# Patient Record
Sex: Male | Born: 1960
Health system: Southern US, Community
[De-identification: ages and names within clinical notes are randomized; demographics above are authoritative.]

## PROBLEM LIST (undated history)

## (undated) DIAGNOSIS — E119 Type 2 diabetes mellitus without complications: Secondary | ICD-10-CM

---

## 2001-11-25 ENCOUNTER — Encounter: Admission: RE | Admit: 2001-11-25 | Discharge: 2001-11-25 | Payer: Self-pay | Admitting: Family Medicine

## 2001-11-25 ENCOUNTER — Encounter: Payer: Self-pay | Admitting: Family Medicine

## 2001-12-15 ENCOUNTER — Ambulatory Visit (HOSPITAL_BASED_OUTPATIENT_CLINIC_OR_DEPARTMENT_OTHER): Admission: RE | Admit: 2001-12-15 | Discharge: 2001-12-15 | Payer: Self-pay | Admitting: Urology

## 2001-12-15 ENCOUNTER — Encounter: Payer: Self-pay | Admitting: Urology

## 2001-12-17 ENCOUNTER — Inpatient Hospital Stay (HOSPITAL_COMMUNITY): Admission: EM | Admit: 2001-12-17 | Discharge: 2001-12-17 | Payer: Self-pay | Admitting: Emergency Medicine

## 2001-12-17 ENCOUNTER — Encounter: Payer: Self-pay | Admitting: Emergency Medicine

## 2001-12-18 ENCOUNTER — Emergency Department (HOSPITAL_COMMUNITY): Admission: EM | Admit: 2001-12-18 | Discharge: 2001-12-18 | Payer: Self-pay | Admitting: Emergency Medicine

## 2015-05-14 DIAGNOSIS — C4491 Basal cell carcinoma of skin, unspecified: Secondary | ICD-10-CM

## 2015-05-14 HISTORY — DX: Basal cell carcinoma of skin, unspecified: C44.91

## 2016-04-17 DIAGNOSIS — E119 Type 2 diabetes mellitus without complications: Secondary | ICD-10-CM | POA: Diagnosis not present

## 2016-04-17 DIAGNOSIS — I10 Essential (primary) hypertension: Secondary | ICD-10-CM | POA: Diagnosis not present

## 2016-04-17 DIAGNOSIS — E1065 Type 1 diabetes mellitus with hyperglycemia: Secondary | ICD-10-CM | POA: Diagnosis not present

## 2016-04-17 DIAGNOSIS — Z794 Long term (current) use of insulin: Secondary | ICD-10-CM | POA: Diagnosis not present

## 2016-04-17 DIAGNOSIS — E11649 Type 2 diabetes mellitus with hypoglycemia without coma: Secondary | ICD-10-CM | POA: Diagnosis not present

## 2016-04-27 DIAGNOSIS — E10649 Type 1 diabetes mellitus with hypoglycemia without coma: Secondary | ICD-10-CM | POA: Diagnosis not present

## 2016-04-27 DIAGNOSIS — E109 Type 1 diabetes mellitus without complications: Secondary | ICD-10-CM | POA: Diagnosis not present

## 2016-04-27 DIAGNOSIS — E11649 Type 2 diabetes mellitus with hypoglycemia without coma: Secondary | ICD-10-CM | POA: Diagnosis not present

## 2016-06-04 DIAGNOSIS — Z794 Long term (current) use of insulin: Secondary | ICD-10-CM | POA: Diagnosis not present

## 2016-06-04 DIAGNOSIS — E109 Type 1 diabetes mellitus without complications: Secondary | ICD-10-CM | POA: Diagnosis not present

## 2016-07-31 DIAGNOSIS — E7800 Pure hypercholesterolemia, unspecified: Secondary | ICD-10-CM | POA: Diagnosis not present

## 2016-07-31 DIAGNOSIS — E1065 Type 1 diabetes mellitus with hyperglycemia: Secondary | ICD-10-CM | POA: Diagnosis not present

## 2016-07-31 DIAGNOSIS — I10 Essential (primary) hypertension: Secondary | ICD-10-CM | POA: Diagnosis not present

## 2016-07-31 DIAGNOSIS — Z794 Long term (current) use of insulin: Secondary | ICD-10-CM | POA: Diagnosis not present

## 2016-09-03 DIAGNOSIS — E109 Type 1 diabetes mellitus without complications: Secondary | ICD-10-CM | POA: Diagnosis not present

## 2016-09-03 DIAGNOSIS — Z794 Long term (current) use of insulin: Secondary | ICD-10-CM | POA: Diagnosis not present

## 2016-10-06 DIAGNOSIS — L989 Disorder of the skin and subcutaneous tissue, unspecified: Secondary | ICD-10-CM | POA: Diagnosis not present

## 2016-10-06 DIAGNOSIS — H8113 Benign paroxysmal vertigo, bilateral: Secondary | ICD-10-CM | POA: Diagnosis not present

## 2016-10-20 DIAGNOSIS — D235 Other benign neoplasm of skin of trunk: Secondary | ICD-10-CM | POA: Diagnosis not present

## 2016-10-20 DIAGNOSIS — L989 Disorder of the skin and subcutaneous tissue, unspecified: Secondary | ICD-10-CM | POA: Diagnosis not present

## 2016-10-28 DIAGNOSIS — I10 Essential (primary) hypertension: Secondary | ICD-10-CM | POA: Diagnosis not present

## 2016-10-28 DIAGNOSIS — R5383 Other fatigue: Secondary | ICD-10-CM | POA: Diagnosis not present

## 2016-10-28 DIAGNOSIS — Z794 Long term (current) use of insulin: Secondary | ICD-10-CM | POA: Diagnosis not present

## 2016-10-28 DIAGNOSIS — E10649 Type 1 diabetes mellitus with hypoglycemia without coma: Secondary | ICD-10-CM | POA: Diagnosis not present

## 2016-10-28 DIAGNOSIS — E1065 Type 1 diabetes mellitus with hyperglycemia: Secondary | ICD-10-CM | POA: Diagnosis not present

## 2017-02-02 DIAGNOSIS — K529 Noninfective gastroenteritis and colitis, unspecified: Secondary | ICD-10-CM | POA: Diagnosis not present

## 2017-02-02 DIAGNOSIS — E1065 Type 1 diabetes mellitus with hyperglycemia: Secondary | ICD-10-CM | POA: Diagnosis not present

## 2017-02-02 DIAGNOSIS — E10649 Type 1 diabetes mellitus with hypoglycemia without coma: Secondary | ICD-10-CM | POA: Diagnosis not present

## 2017-02-02 DIAGNOSIS — I10 Essential (primary) hypertension: Secondary | ICD-10-CM | POA: Diagnosis not present

## 2017-02-04 DIAGNOSIS — K529 Noninfective gastroenteritis and colitis, unspecified: Secondary | ICD-10-CM | POA: Diagnosis not present

## 2017-02-15 DIAGNOSIS — R197 Diarrhea, unspecified: Secondary | ICD-10-CM | POA: Diagnosis not present

## 2017-02-15 DIAGNOSIS — Z23 Encounter for immunization: Secondary | ICD-10-CM | POA: Diagnosis not present

## 2017-02-15 DIAGNOSIS — R1084 Generalized abdominal pain: Secondary | ICD-10-CM | POA: Diagnosis not present

## 2017-02-15 DIAGNOSIS — R143 Flatulence: Secondary | ICD-10-CM | POA: Diagnosis not present

## 2017-02-19 DIAGNOSIS — R198 Other specified symptoms and signs involving the digestive system and abdomen: Secondary | ICD-10-CM | POA: Diagnosis not present

## 2017-02-19 DIAGNOSIS — R197 Diarrhea, unspecified: Secondary | ICD-10-CM | POA: Diagnosis not present

## 2017-02-25 DIAGNOSIS — Z794 Long term (current) use of insulin: Secondary | ICD-10-CM | POA: Diagnosis not present

## 2017-02-25 DIAGNOSIS — E109 Type 1 diabetes mellitus without complications: Secondary | ICD-10-CM | POA: Diagnosis not present

## 2017-03-08 DIAGNOSIS — K900 Celiac disease: Secondary | ICD-10-CM | POA: Diagnosis not present

## 2017-06-03 DIAGNOSIS — R05 Cough: Secondary | ICD-10-CM | POA: Diagnosis not present

## 2017-06-03 DIAGNOSIS — E109 Type 1 diabetes mellitus without complications: Secondary | ICD-10-CM | POA: Diagnosis not present

## 2017-06-03 DIAGNOSIS — Z794 Long term (current) use of insulin: Secondary | ICD-10-CM | POA: Diagnosis not present

## 2017-06-05 ENCOUNTER — Encounter (HOSPITAL_COMMUNITY): Payer: Self-pay

## 2017-06-05 ENCOUNTER — Emergency Department (HOSPITAL_COMMUNITY)
Admission: EM | Admit: 2017-06-05 | Discharge: 2017-06-05 | Disposition: A | Discharge status: Home / Self Care | Payer: BLUE CROSS/BLUE SHIELD | Attending: Physician Assistant | Admitting: Physician Assistant

## 2017-06-05 ENCOUNTER — Emergency Department (HOSPITAL_COMMUNITY): Payer: BLUE CROSS/BLUE SHIELD

## 2017-06-05 DIAGNOSIS — R109 Unspecified abdominal pain: Secondary | ICD-10-CM | POA: Diagnosis not present

## 2017-06-05 DIAGNOSIS — J069 Acute upper respiratory infection, unspecified: Secondary | ICD-10-CM | POA: Diagnosis not present

## 2017-06-05 DIAGNOSIS — R05 Cough: Secondary | ICD-10-CM | POA: Diagnosis not present

## 2017-06-05 DIAGNOSIS — E876 Hypokalemia: Secondary | ICD-10-CM | POA: Diagnosis not present

## 2017-06-05 DIAGNOSIS — E119 Type 2 diabetes mellitus without complications: Secondary | ICD-10-CM | POA: Diagnosis not present

## 2017-06-05 DIAGNOSIS — R059 Cough, unspecified: Secondary | ICD-10-CM

## 2017-06-05 HISTORY — DX: Type 2 diabetes mellitus without complications: E11.9

## 2017-06-05 LAB — COMPREHENSIVE METABOLIC PANEL
ALT: 47 U/L (ref 17–63)
ANION GAP: 9 (ref 5–15)
AST: 39 U/L (ref 15–41)
Albumin: 3.5 g/dL (ref 3.5–5.0)
Alkaline Phosphatase: 75 U/L (ref 38–126)
BILIRUBIN TOTAL: 0.4 mg/dL (ref 0.3–1.2)
BUN: 7 mg/dL (ref 6–20)
CO2: 27 mmol/L (ref 22–32)
Calcium: 9 mg/dL (ref 8.9–10.3)
Chloride: 101 mmol/L (ref 101–111)
Creatinine, Ser: 0.95 mg/dL (ref 0.61–1.24)
GFR calc Af Amer: >60 mL/min (ref >60)
Glucose, Bld: 170 mg/dL — ABNORMAL HIGH (ref 65–99)
POTASSIUM: 3.4 mmol/L — AB (ref 3.5–5.1)
Sodium: 137 mmol/L (ref 135–145)
TOTAL PROTEIN: 6.6 g/dL (ref 6.5–8.1)

## 2017-06-05 LAB — URINALYSIS, ROUTINE W REFLEX MICROSCOPIC
Bacteria, UA: NONE SEEN
Bilirubin Urine: NEGATIVE
Glucose, UA: NEGATIVE mg/dL
Hgb urine dipstick: NEGATIVE
Ketones, ur: NEGATIVE mg/dL
Leukocytes, UA: NEGATIVE
Nitrite: NEGATIVE
Protein, ur: NEGATIVE mg/dL
Specific Gravity, Urine: 1.015 (ref 1.005–1.030)
Squamous Epithelial / HPF: NONE SEEN
pH: 5 (ref 5.0–8.0)

## 2017-06-05 LAB — CBC
HEMATOCRIT: 38.5 % — AB (ref 39.0–52.0)
HEMOGLOBIN: 12.8 g/dL — AB (ref 13.0–17.0)
MCH: 30.8 pg (ref 26.0–34.0)
MCHC: 33.2 g/dL (ref 30.0–36.0)
MCV: 92.8 fL (ref 78.0–100.0)
Platelets: 295 10*3/uL (ref 150–400)
RBC: 4.15 MIL/uL — ABNORMAL LOW (ref 4.22–5.81)
RDW: 12.9 % (ref 11.5–15.5)
WBC: 13.1 10*3/uL — AB (ref 4.0–10.5)

## 2017-06-05 LAB — LIPASE, BLOOD: Lipase: 22 U/L (ref 11–51)

## 2017-06-05 MED ORDER — POTASSIUM CHLORIDE CRYS ER 20 MEQ PO TBCR
40.0000 meq | EXTENDED_RELEASE_TABLET | Freq: Once | ORAL | Status: AC
  Administered 2017-06-05: 40 meq via ORAL
  Filled 2017-06-05: qty 2

## 2017-06-05 NOTE — Discharge Instructions (Signed)
Your potassium was slightly low, but you were given some potassium here. See the list of foods below to help increase your potassium in your diet. Alternate between tylenol and motrin as needed for pain. Alternate between ice and heat to areas of soreness, no more than 20 minutes at a time every hour for each. Follow up with primary care physician for recheck of ongoing symptoms in the next 5-7 days. Return to ER for emergent changing or worsening of symptoms.

## 2017-06-05 NOTE — ED Provider Notes (Signed)
[facility] DEPARTMENT Provider Note   CSN: [id number] Arrival date & time: 06/05/17  4098     History   Chief Complaint Chief Complaint  Patient presents with  . Flank Pain  . URI    HPI Caleb Dominguez is a 56 y.o. male with a PMHx of DM1, HTN, nephrolithiasis, and HLD, who presents to the ED with complaints of bilateral flank pain L>R x 2 days.  Patient states that he has had cold symptoms including dry cough, sinus congestion, sore throat, fatigue, 2-4 episodes daily of NB looser than normal stool, and low-grade fever 99.9 over the last 1 week, he initially improved but then got worse, was seen by his PCP and given a Z-Pak which he started yesterday when his temperature became 99.9.  He states however that 2 days ago he developed gradual onset flank pain which is why he came to the emergency room.  Patient states this feels different than his prior kidney stones.  He describes the pain as 2/10 constant waxing and waning dull and aching nonradiating flank pain worse on the left but somewhat bilaterally, which worsens with laying flat, improves with sitting up and using heat, and has been unrelieved with ibuprofen and Tylenol.  He denies any recent heavy lifting or injuries to his back.  He denies fevers >100, chills, ear drainage, drooling, trismus, wheezing, CP, SOB, abd pain, nausea/vomiting, constipation, obstipation, melena, hematochezia, hematuria, dysuria, urine frequency, testicular pain/swelling, penile discharge, incontinence of urine/stool, saddle anesthesia/cauda equina symptoms, myalgias, arthralgias, numbness, tingling, focal weakness, or any other complaints at this time.     The history is provided by the patient and medical records. No language interpreter was used.  Flank Pain  This is a new problem. The current episode started 2 days ago. The problem occurs constantly. The problem has not changed since onset.Pertinent negatives include no  chest pain, no abdominal pain and no shortness of breath. Exacerbated by: laying down. The symptoms are relieved by heat (sitting up, and heat). He has tried a warm compress and acetaminophen (and ibuprofen) for the symptoms. The treatment provided mild relief.  URI   Associated symptoms include diarrhea, congestion, sore throat and cough. Pertinent negatives include no chest pain, no abdominal pain, no nausea, no vomiting, no dysuria and no wheezing.    Past Medical History:  Diagnosis Date  . Diabetes mellitus without complication ([place])     There are no active problems to display for this patient.   History reviewed. No pertinent surgical history.     Home Medications    Prior to Admission medications   Not on File    Family History No family history on file.  Social History Social History   Tobacco Use  . Smoking status: Not on file  Substance Use Topics  . Alcohol use: Not on file  . Drug use: Not on file     Allergies   Gluten meal   Review of Systems Review of Systems  Constitutional: Positive for fatigue. Negative for chills and fever.  HENT: Positive for congestion and sore throat. Negative for drooling, ear discharge and trouble swallowing.   Respiratory: Positive for cough. Negative for shortness of breath and wheezing.   Cardiovascular: Negative for chest pain.  Gastrointestinal: Positive for diarrhea. Negative for abdominal pain, blood in stool, constipation, nausea and vomiting.  Genitourinary: Positive for flank pain. Negative for difficulty urinating (no incontinence), discharge, dysuria, frequency, hematuria, scrotal swelling and testicular pain.  Musculoskeletal:  Negative for arthralgias and myalgias.  Skin: Negative for color change.  Allergic/Immunologic: Positive for immunocompromised state (DM1).  Neurological: Negative for weakness and numbness.  Psychiatric/Behavioral: Negative for confusion.   All other systems reviewed and are negative  for acute change except as noted in the HPI.    Physical Exam Updated Vital Signs BP 130/82 (BP Location: Right Arm)   Pulse 77  Temp 98.1 F (36.7 C) (Oral)   Resp 12   Ht 5' 5.5" (1.664 m)   Wt 77.1 kg (170 lb)   SpO2 100%   BMI 27.86 kg/m   Physical Exam  Constitutional: He is oriented to person, place, and time. Vital signs are normal. He appears well-developed and well-nourished.  Non-toxic appearance. No distress.  Afebrile, nontoxic, NAD  HENT:  Head: Normocephalic and atraumatic.  Nose: Mucosal edema present.  Mouth/Throat: Uvula is midline and mucous membranes are normal. No trismus in the jaw. No uvula swelling. Posterior oropharyngeal erythema present. No oropharyngeal exudate, posterior oropharyngeal edema or tonsillar abscesses. Tonsils are 1+ on the right. Tonsils are 1+ on the left. No tonsillar exudate.  Nose mildly congested. Oropharynx mildly injected, without uvular swelling or deviation, no trismus or drooling, mild 1+ b/l tonsillar hypertrophy but no tonsillar swelling or exudates.    Eyes: Conjunctivae and EOM are normal. Right eye exhibits no discharge. Left eye exhibits no discharge.  Neck: Normal range of motion. Neck supple.  Cardiovascular: Normal rate, regular rhythm, normal heart sounds and intact distal pulses. Exam reveals no gallop and no friction rub.  No murmur heard. Pulmonary/Chest: Effort normal and breath sounds normal. No respiratory distress. He has no decreased breath sounds. He has no wheezes. He has no rhonchi. He has no rales.  CTAB in all lung fields, no w/r/r, no hypoxia or increased WOB, speaking in full sentences, SpO2 99% on RA   Abdominal: Soft. Normal appearance and bowel sounds are normal. He exhibits no distension. There is no tenderness. There is no rigidity, no rebound, no guarding, no CVA tenderness, no tenderness at McBurney's point and negative Murphy's sign.  Soft, NTND, +BS throughout, no r/g/r, neg murphy's, neg mcburney's, no  CVA TTP   Musculoskeletal: Normal range of motion.       Thoracic back: He exhibits normal range of motion, no tenderness, no bony tenderness and no spasm.  All spinal levels with FROM intact without spinous process TTP, no bony stepoffs or deformities, no paraspinous muscle TTP or muscle spasms. Strength and sensation grossly intact in all extremities, gait steady and nonantalgic. No overlying skin changes. Distal pulses intact.   Neurological: He is alert and oriented to person, place, and time. He has normal strength. No sensory deficit.  Skin: Skin is warm, dry and intact. No rash noted.  Psychiatric: He has a normal mood and affect.  Nursing note and vitals reviewed.    ED Treatments / Results  Labs (all labs ordered are listed, but only abnormal results are displayed) Labs Reviewed  COMPREHENSIVE METABOLIC PANEL - Abnormal; Notable for the following components:      Result Value   Potassium 3.4 (*)    Glucose, Bld 170 (*)    All other components within normal limits  CBC - Abnormal; Notable for the following components:   WBC 13.1 (*)    RBC 4.15 (*)    Hemoglobin 12.8 (*)    HCT 38.5 (*)    All other components within normal limits  URINALYSIS, ROUTINE W REFLEX MICROSCOPIC  LIPASE,  BLOOD    EKG  EKG Interpretation None       Radiology Dg Chest 2 View  Result Date: 06/05/2017 CLINICAL DATA:  Cough, evaluate for pneumonia. EXAM: CHEST  2 VIEW COMPARISON:  None. FINDINGS: The heart size and mediastinal contours are within normal limits. There is no focal infiltrate, pulmonary edema, or pleural effusion. The visualized skeletal structures are unremarkable. IMPRESSION: No active cardiopulmonary disease. Electronically Signed   By: Abelardo Diesel M.D.   On: 06/05/2017 13:47    Procedures Procedures (including critical care time)  Medications Ordered in ED Medications  potassium chloride SA (K-DUR,KLOR-CON) CR tablet 40 mEq (40 mEq Oral Given 06/05/17 1324)      Initial Impression / Assessment and Plan / ED Course  I have reviewed the triage vital signs and the nursing notes.  Pertinent labs & imaging results that were available during my care of the patient were reviewed by me and considered in my medical decision making (see chart for details).     56 y.o. male here with b/l flank pain x2 days; has also had URI symptoms x1wk for which he was started on azithromycin yesterday (no CXR obtained at PCPs office). On exam, no flank tenderness, no abdominal tenderness, no reproducible tenderness to all spinal levels and paraspinous muscles. Work up thus far shows: lipase WNL; CMP with marginally low K 3.4, will replete orally, also with gluc 170 but no anion gap or other findings; CBC with marginally elevated WBC 13.1; U/A unremarkable, no evidence of hematuria or UTI. Will get CXR to see if he possibly has PNA to explain his back pain; pt declines wanting anything for pain. Will reassess shortly.   3:01 PM CXR negative. Pain could be from coughing, vs other musculoskeletal pain. Advised ice/heat use, tylenol/motrin use, and f/up with PCP in 5-7 days for recheck of symptoms. I explained the diagnosis and have given explicit precautions to return to the ER including for any other new or worsening symptoms. The patient understands and accepts the medical plan as it's been dictated and I have answered their questions. Discharge instructions concerning home care and prescriptions have been given. The patient is STABLE and is discharged to home in good condition.    Final Clinical Impressions(s) / ED Diagnoses   Final diagnoses:  Bilateral flank pain  Upper respiratory tract infection, unspecified type  Cough  Hypokalemia    ED Discharge Orders    [address] [place], [place] 06/05/17 1501    Macarthur Critchley, MD 06/08/17 2216

## 2017-06-05 NOTE — ED Triage Notes (Addendum)
Pt presents for evaluation of URI symptoms x 4-5 days. Pt reports lower back/flank pain bilaterally began 2 nights ago and has continued to worsen. Hx of T1DM. Reports generalized fatigue, weight gain and low grade fever. Pt reports some diarrhea.

## 2017-06-05 NOTE — ED Notes (Signed)
ED Provider at bedside. 

## 2017-06-05 NOTE — ED Notes (Signed)
Patient transported to X-ray 

## 2017-07-07 DIAGNOSIS — D044 Carcinoma in situ of skin of scalp and neck: Secondary | ICD-10-CM | POA: Diagnosis not present

## 2017-07-07 DIAGNOSIS — C4492 Squamous cell carcinoma of skin, unspecified: Secondary | ICD-10-CM

## 2017-07-07 HISTORY — DX: Squamous cell carcinoma of skin, unspecified: C44.92

## 2017-08-05 DIAGNOSIS — E109 Type 1 diabetes mellitus without complications: Secondary | ICD-10-CM | POA: Diagnosis not present

## 2017-08-05 DIAGNOSIS — I10 Essential (primary) hypertension: Secondary | ICD-10-CM | POA: Diagnosis not present

## 2017-08-05 DIAGNOSIS — D649 Anemia, unspecified: Secondary | ICD-10-CM | POA: Diagnosis not present

## 2017-08-05 DIAGNOSIS — E10649 Type 1 diabetes mellitus with hypoglycemia without coma: Secondary | ICD-10-CM | POA: Diagnosis not present

## 2017-08-12 DIAGNOSIS — R5383 Other fatigue: Secondary | ICD-10-CM | POA: Diagnosis not present

## 2017-08-12 DIAGNOSIS — R42 Dizziness and giddiness: Secondary | ICD-10-CM | POA: Diagnosis not present

## 2017-08-12 DIAGNOSIS — M2550 Pain in unspecified joint: Secondary | ICD-10-CM | POA: Diagnosis not present

## 2017-08-12 DIAGNOSIS — M791 Myalgia, unspecified site: Secondary | ICD-10-CM | POA: Diagnosis not present

## 2017-08-12 DIAGNOSIS — D6489 Other specified anemias: Secondary | ICD-10-CM | POA: Diagnosis not present

## 2017-10-08 DIAGNOSIS — J209 Acute bronchitis, unspecified: Secondary | ICD-10-CM | POA: Diagnosis not present

## 2017-10-27 DIAGNOSIS — H33193 Other retinoschisis and retinal cysts, bilateral: Secondary | ICD-10-CM | POA: Diagnosis not present

## 2017-10-27 DIAGNOSIS — E113293 Type 2 diabetes mellitus with mild nonproliferative diabetic retinopathy without macular edema, bilateral: Secondary | ICD-10-CM | POA: Diagnosis not present

## 2017-11-05 DIAGNOSIS — E7800 Pure hypercholesterolemia, unspecified: Secondary | ICD-10-CM | POA: Diagnosis not present

## 2017-11-05 DIAGNOSIS — E109 Type 1 diabetes mellitus without complications: Secondary | ICD-10-CM | POA: Diagnosis not present

## 2017-11-05 DIAGNOSIS — E10649 Type 1 diabetes mellitus with hypoglycemia without coma: Secondary | ICD-10-CM | POA: Diagnosis not present

## 2017-11-05 DIAGNOSIS — K9041 Non-celiac gluten sensitivity: Secondary | ICD-10-CM | POA: Diagnosis not present

## 2017-12-03 DIAGNOSIS — E109 Type 1 diabetes mellitus without complications: Secondary | ICD-10-CM | POA: Diagnosis not present

## 2017-12-03 DIAGNOSIS — Z794 Long term (current) use of insulin: Secondary | ICD-10-CM | POA: Diagnosis not present

## 2018-02-04 DIAGNOSIS — E109 Type 1 diabetes mellitus without complications: Secondary | ICD-10-CM | POA: Diagnosis not present

## 2018-02-04 DIAGNOSIS — E10649 Type 1 diabetes mellitus with hypoglycemia without coma: Secondary | ICD-10-CM | POA: Diagnosis not present

## 2018-02-04 DIAGNOSIS — E7800 Pure hypercholesterolemia, unspecified: Secondary | ICD-10-CM | POA: Diagnosis not present

## 2018-02-04 DIAGNOSIS — I10 Essential (primary) hypertension: Secondary | ICD-10-CM | POA: Diagnosis not present

## 2018-02-16 DIAGNOSIS — M25512 Pain in left shoulder: Secondary | ICD-10-CM | POA: Diagnosis not present

## 2018-02-16 DIAGNOSIS — M7502 Adhesive capsulitis of left shoulder: Secondary | ICD-10-CM | POA: Diagnosis not present

## 2018-02-17 DIAGNOSIS — M7502 Adhesive capsulitis of left shoulder: Secondary | ICD-10-CM | POA: Diagnosis not present

## 2018-02-21 DIAGNOSIS — M7502 Adhesive capsulitis of left shoulder: Secondary | ICD-10-CM | POA: Diagnosis not present

## 2018-02-22 DIAGNOSIS — Z794 Long term (current) use of insulin: Secondary | ICD-10-CM | POA: Diagnosis not present

## 2018-02-22 DIAGNOSIS — E109 Type 1 diabetes mellitus without complications: Secondary | ICD-10-CM | POA: Diagnosis not present

## 2018-02-24 DIAGNOSIS — E109 Type 1 diabetes mellitus without complications: Secondary | ICD-10-CM | POA: Diagnosis not present

## 2018-02-24 DIAGNOSIS — Z794 Long term (current) use of insulin: Secondary | ICD-10-CM | POA: Diagnosis not present

## 2018-02-25 DIAGNOSIS — M7502 Adhesive capsulitis of left shoulder: Secondary | ICD-10-CM | POA: Diagnosis not present

## 2018-02-28 DIAGNOSIS — M7502 Adhesive capsulitis of left shoulder: Secondary | ICD-10-CM | POA: Diagnosis not present

## 2018-03-03 DIAGNOSIS — M7502 Adhesive capsulitis of left shoulder: Secondary | ICD-10-CM | POA: Diagnosis not present

## 2018-03-07 DIAGNOSIS — Z794 Long term (current) use of insulin: Secondary | ICD-10-CM | POA: Diagnosis not present

## 2018-03-07 DIAGNOSIS — E109 Type 1 diabetes mellitus without complications: Secondary | ICD-10-CM | POA: Diagnosis not present

## 2018-03-08 DIAGNOSIS — M7502 Adhesive capsulitis of left shoulder: Secondary | ICD-10-CM | POA: Diagnosis not present

## 2018-03-10 DIAGNOSIS — M7502 Adhesive capsulitis of left shoulder: Secondary | ICD-10-CM | POA: Diagnosis not present

## 2018-03-14 DIAGNOSIS — M7502 Adhesive capsulitis of left shoulder: Secondary | ICD-10-CM | POA: Diagnosis not present

## 2018-03-17 DIAGNOSIS — M7502 Adhesive capsulitis of left shoulder: Secondary | ICD-10-CM | POA: Diagnosis not present

## 2018-03-21 DIAGNOSIS — M7502 Adhesive capsulitis of left shoulder: Secondary | ICD-10-CM | POA: Diagnosis not present

## 2018-03-24 DIAGNOSIS — M7502 Adhesive capsulitis of left shoulder: Secondary | ICD-10-CM | POA: Diagnosis not present

## 2018-03-29 DIAGNOSIS — M7502 Adhesive capsulitis of left shoulder: Secondary | ICD-10-CM | POA: Diagnosis not present

## 2018-03-30 DIAGNOSIS — M7502 Adhesive capsulitis of left shoulder: Secondary | ICD-10-CM | POA: Diagnosis not present

## 2018-03-30 DIAGNOSIS — M25512 Pain in left shoulder: Secondary | ICD-10-CM | POA: Diagnosis not present

## 2018-04-08 DIAGNOSIS — M7502 Adhesive capsulitis of left shoulder: Secondary | ICD-10-CM | POA: Diagnosis not present

## 2018-04-14 DIAGNOSIS — M7502 Adhesive capsulitis of left shoulder: Secondary | ICD-10-CM | POA: Diagnosis not present

## 2018-04-21 DIAGNOSIS — M7502 Adhesive capsulitis of left shoulder: Secondary | ICD-10-CM | POA: Diagnosis not present

## 2018-04-28 DIAGNOSIS — M7502 Adhesive capsulitis of left shoulder: Secondary | ICD-10-CM | POA: Diagnosis not present

## 2018-05-03 DIAGNOSIS — M7502 Adhesive capsulitis of left shoulder: Secondary | ICD-10-CM | POA: Diagnosis not present

## 2018-05-10 DIAGNOSIS — M7502 Adhesive capsulitis of left shoulder: Secondary | ICD-10-CM | POA: Diagnosis not present

## 2018-05-12 DIAGNOSIS — M7502 Adhesive capsulitis of left shoulder: Secondary | ICD-10-CM | POA: Diagnosis not present

## 2018-05-13 DIAGNOSIS — E10649 Type 1 diabetes mellitus with hypoglycemia without coma: Secondary | ICD-10-CM | POA: Diagnosis not present

## 2018-05-13 DIAGNOSIS — Z794 Long term (current) use of insulin: Secondary | ICD-10-CM | POA: Diagnosis not present

## 2018-05-17 DIAGNOSIS — M7502 Adhesive capsulitis of left shoulder: Secondary | ICD-10-CM | POA: Diagnosis not present

## 2018-05-19 DIAGNOSIS — M7502 Adhesive capsulitis of left shoulder: Secondary | ICD-10-CM | POA: Diagnosis not present

## 2018-05-24 DIAGNOSIS — M7502 Adhesive capsulitis of left shoulder: Secondary | ICD-10-CM | POA: Diagnosis not present

## 2018-05-26 DIAGNOSIS — M7502 Adhesive capsulitis of left shoulder: Secondary | ICD-10-CM | POA: Diagnosis not present

## 2018-05-30 DIAGNOSIS — M7502 Adhesive capsulitis of left shoulder: Secondary | ICD-10-CM | POA: Diagnosis not present

## 2018-06-07 DIAGNOSIS — E109 Type 1 diabetes mellitus without complications: Secondary | ICD-10-CM | POA: Diagnosis not present

## 2018-06-07 DIAGNOSIS — Z794 Long term (current) use of insulin: Secondary | ICD-10-CM | POA: Diagnosis not present

## 2018-07-03 IMAGING — DX DG CHEST 2V
2 series · 2 of 2 positions shown · non-contrast
Comparison: None.

CLINICAL DATA: Cough, evaluate for pneumonia.

EXAM:
CHEST  2 VIEW

[w chest lat]
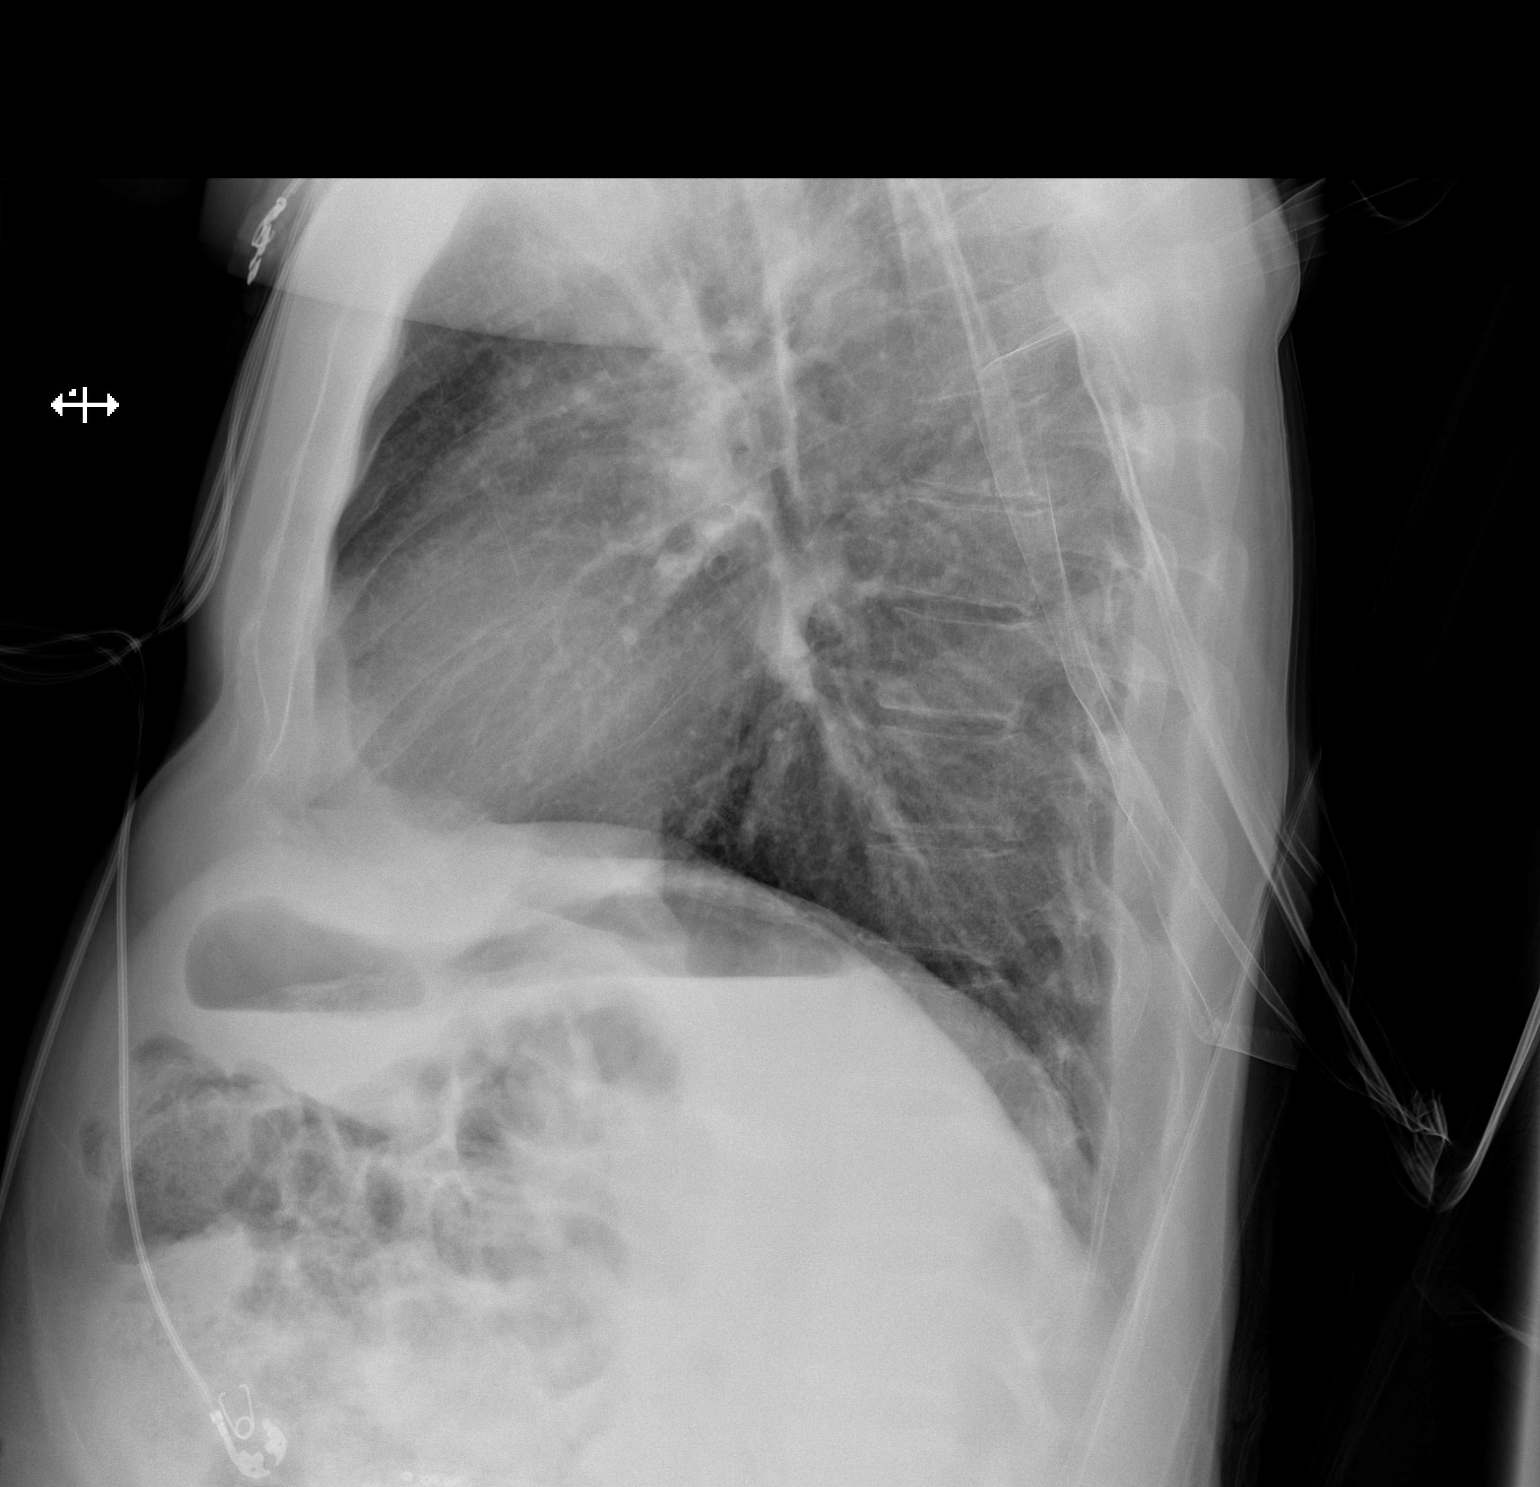

[x chest ap]
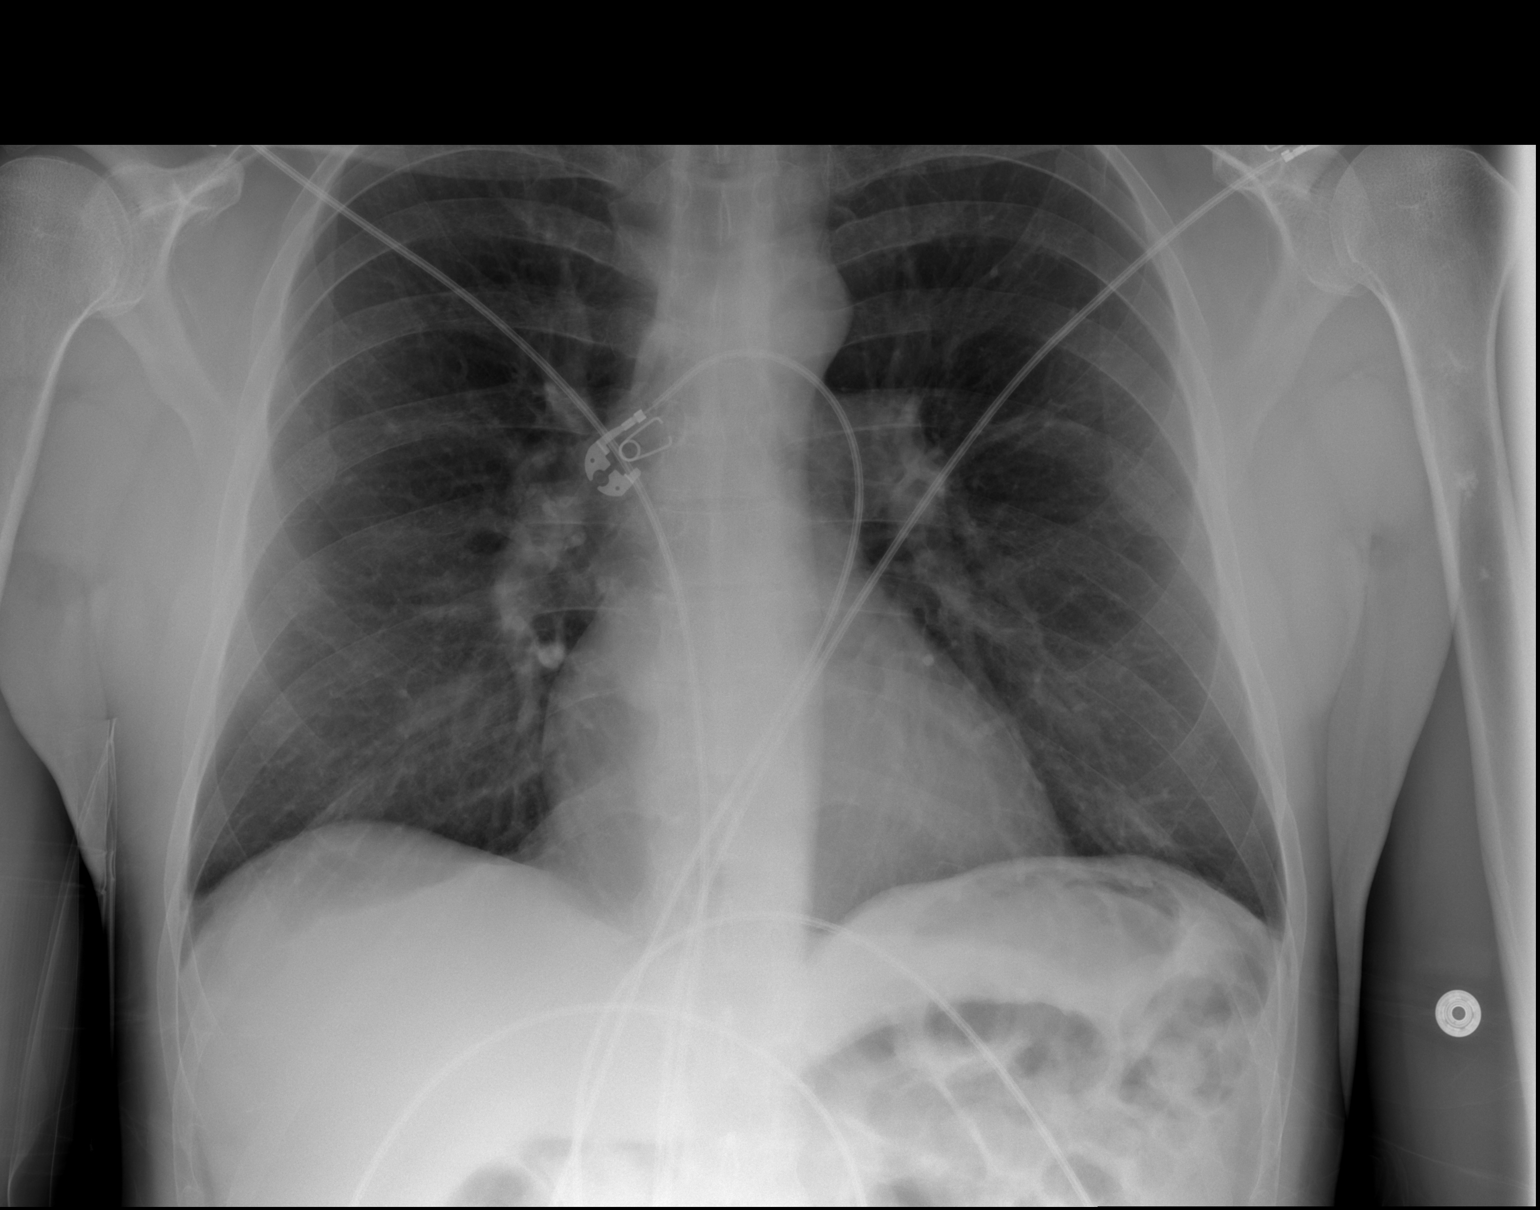

[2 of 2 positions shown; findings below may reference images not displayed]

FINDINGS: The heart size and mediastinal contours are within normal limits.
There is no focal infiltrate, pulmonary edema, or pleural effusion.
The visualized skeletal structures are unremarkable.
IMPRESSION: No active cardiopulmonary disease.

## 2018-07-19 DIAGNOSIS — Z794 Long term (current) use of insulin: Secondary | ICD-10-CM | POA: Diagnosis not present

## 2018-07-19 DIAGNOSIS — E10649 Type 1 diabetes mellitus with hypoglycemia without coma: Secondary | ICD-10-CM | POA: Diagnosis not present

## 2018-08-09 DIAGNOSIS — E109 Type 1 diabetes mellitus without complications: Secondary | ICD-10-CM | POA: Diagnosis not present

## 2018-08-09 DIAGNOSIS — I10 Essential (primary) hypertension: Secondary | ICD-10-CM | POA: Diagnosis not present

## 2018-08-09 DIAGNOSIS — E7800 Pure hypercholesterolemia, unspecified: Secondary | ICD-10-CM | POA: Diagnosis not present

## 2018-08-09 DIAGNOSIS — E10649 Type 1 diabetes mellitus with hypoglycemia without coma: Secondary | ICD-10-CM | POA: Diagnosis not present

## 2018-08-30 DIAGNOSIS — L570 Actinic keratosis: Secondary | ICD-10-CM | POA: Diagnosis not present

## 2018-08-30 DIAGNOSIS — D229 Melanocytic nevi, unspecified: Secondary | ICD-10-CM | POA: Diagnosis not present

## 2018-09-07 DIAGNOSIS — E1065 Type 1 diabetes mellitus with hyperglycemia: Secondary | ICD-10-CM | POA: Diagnosis not present

## 2018-09-07 DIAGNOSIS — Z794 Long term (current) use of insulin: Secondary | ICD-10-CM | POA: Diagnosis not present

## 2018-09-07 DIAGNOSIS — E109 Type 1 diabetes mellitus without complications: Secondary | ICD-10-CM | POA: Diagnosis not present

## 2018-11-01 DIAGNOSIS — E103293 Type 1 diabetes mellitus with mild nonproliferative diabetic retinopathy without macular edema, bilateral: Secondary | ICD-10-CM | POA: Diagnosis not present

## 2018-11-01 DIAGNOSIS — H33193 Other retinoschisis and retinal cysts, bilateral: Secondary | ICD-10-CM | POA: Diagnosis not present

## 2018-11-10 DIAGNOSIS — E10649 Type 1 diabetes mellitus with hypoglycemia without coma: Secondary | ICD-10-CM | POA: Diagnosis not present

## 2018-11-10 DIAGNOSIS — I10 Essential (primary) hypertension: Secondary | ICD-10-CM | POA: Diagnosis not present

## 2018-11-10 DIAGNOSIS — E7800 Pure hypercholesterolemia, unspecified: Secondary | ICD-10-CM | POA: Diagnosis not present

## 2018-11-10 DIAGNOSIS — E109 Type 1 diabetes mellitus without complications: Secondary | ICD-10-CM | POA: Diagnosis not present

## 2018-11-10 DIAGNOSIS — K9041 Non-celiac gluten sensitivity: Secondary | ICD-10-CM | POA: Diagnosis not present

## 2018-12-01 DIAGNOSIS — Z794 Long term (current) use of insulin: Secondary | ICD-10-CM | POA: Diagnosis not present

## 2018-12-01 DIAGNOSIS — E109 Type 1 diabetes mellitus without complications: Secondary | ICD-10-CM | POA: Diagnosis not present

## 2018-12-01 DIAGNOSIS — E1065 Type 1 diabetes mellitus with hyperglycemia: Secondary | ICD-10-CM | POA: Diagnosis not present

## 2019-03-09 DIAGNOSIS — E1065 Type 1 diabetes mellitus with hyperglycemia: Secondary | ICD-10-CM | POA: Diagnosis not present

## 2019-03-09 DIAGNOSIS — Z794 Long term (current) use of insulin: Secondary | ICD-10-CM | POA: Diagnosis not present

## 2019-03-09 DIAGNOSIS — E109 Type 1 diabetes mellitus without complications: Secondary | ICD-10-CM | POA: Diagnosis not present

## 2019-03-13 DIAGNOSIS — I10 Essential (primary) hypertension: Secondary | ICD-10-CM | POA: Diagnosis not present

## 2019-03-13 DIAGNOSIS — E109 Type 1 diabetes mellitus without complications: Secondary | ICD-10-CM | POA: Diagnosis not present

## 2019-03-13 DIAGNOSIS — K9041 Non-celiac gluten sensitivity: Secondary | ICD-10-CM | POA: Diagnosis not present

## 2019-03-13 DIAGNOSIS — E10649 Type 1 diabetes mellitus with hypoglycemia without coma: Secondary | ICD-10-CM | POA: Diagnosis not present

## 2019-05-29 DIAGNOSIS — E109 Type 1 diabetes mellitus without complications: Secondary | ICD-10-CM | POA: Diagnosis not present

## 2019-05-29 DIAGNOSIS — Z794 Long term (current) use of insulin: Secondary | ICD-10-CM | POA: Diagnosis not present

## 2019-05-29 DIAGNOSIS — E1065 Type 1 diabetes mellitus with hyperglycemia: Secondary | ICD-10-CM | POA: Diagnosis not present

## 2019-08-28 DIAGNOSIS — E109 Type 1 diabetes mellitus without complications: Secondary | ICD-10-CM | POA: Diagnosis not present

## 2019-08-28 DIAGNOSIS — E1065 Type 1 diabetes mellitus with hyperglycemia: Secondary | ICD-10-CM | POA: Diagnosis not present

## 2019-08-28 DIAGNOSIS — Z794 Long term (current) use of insulin: Secondary | ICD-10-CM | POA: Diagnosis not present

## 2019-09-05 DIAGNOSIS — E559 Vitamin D deficiency, unspecified: Secondary | ICD-10-CM | POA: Diagnosis not present

## 2019-09-05 DIAGNOSIS — E109 Type 1 diabetes mellitus without complications: Secondary | ICD-10-CM | POA: Diagnosis not present

## 2019-09-05 DIAGNOSIS — R5383 Other fatigue: Secondary | ICD-10-CM | POA: Diagnosis not present

## 2019-09-12 DIAGNOSIS — E10649 Type 1 diabetes mellitus with hypoglycemia without coma: Secondary | ICD-10-CM | POA: Diagnosis not present

## 2019-09-12 DIAGNOSIS — E109 Type 1 diabetes mellitus without complications: Secondary | ICD-10-CM | POA: Diagnosis not present

## 2019-09-12 DIAGNOSIS — I10 Essential (primary) hypertension: Secondary | ICD-10-CM | POA: Diagnosis not present

## 2019-09-12 DIAGNOSIS — E7800 Pure hypercholesterolemia, unspecified: Secondary | ICD-10-CM | POA: Diagnosis not present

## 2019-11-01 DIAGNOSIS — E103293 Type 1 diabetes mellitus with mild nonproliferative diabetic retinopathy without macular edema, bilateral: Secondary | ICD-10-CM | POA: Diagnosis not present

## 2019-11-01 DIAGNOSIS — H33193 Other retinoschisis and retinal cysts, bilateral: Secondary | ICD-10-CM | POA: Diagnosis not present

## 2019-11-01 DIAGNOSIS — H43823 Vitreomacular adhesion, bilateral: Secondary | ICD-10-CM | POA: Diagnosis not present

## 2019-11-28 DIAGNOSIS — Z794 Long term (current) use of insulin: Secondary | ICD-10-CM | POA: Diagnosis not present

## 2019-11-28 DIAGNOSIS — E1065 Type 1 diabetes mellitus with hyperglycemia: Secondary | ICD-10-CM | POA: Diagnosis not present

## 2019-11-28 DIAGNOSIS — E109 Type 1 diabetes mellitus without complications: Secondary | ICD-10-CM | POA: Diagnosis not present

## 2020-01-15 DIAGNOSIS — I10 Essential (primary) hypertension: Secondary | ICD-10-CM | POA: Diagnosis not present

## 2020-01-15 DIAGNOSIS — E7800 Pure hypercholesterolemia, unspecified: Secondary | ICD-10-CM | POA: Diagnosis not present

## 2020-01-15 DIAGNOSIS — E10649 Type 1 diabetes mellitus with hypoglycemia without coma: Secondary | ICD-10-CM | POA: Diagnosis not present

## 2020-01-15 DIAGNOSIS — E109 Type 1 diabetes mellitus without complications: Secondary | ICD-10-CM | POA: Diagnosis not present

## 2020-02-28 DIAGNOSIS — Z794 Long term (current) use of insulin: Secondary | ICD-10-CM | POA: Diagnosis not present

## 2020-02-28 DIAGNOSIS — E1065 Type 1 diabetes mellitus with hyperglycemia: Secondary | ICD-10-CM | POA: Diagnosis not present

## 2020-02-28 DIAGNOSIS — E109 Type 1 diabetes mellitus without complications: Secondary | ICD-10-CM | POA: Diagnosis not present

## 2020-04-26 DIAGNOSIS — R300 Dysuria: Secondary | ICD-10-CM | POA: Diagnosis not present

## 2020-05-17 DIAGNOSIS — N200 Calculus of kidney: Secondary | ICD-10-CM | POA: Diagnosis not present

## 2020-05-17 DIAGNOSIS — N401 Enlarged prostate with lower urinary tract symptoms: Secondary | ICD-10-CM | POA: Diagnosis not present

## 2020-05-21 DIAGNOSIS — E10649 Type 1 diabetes mellitus with hypoglycemia without coma: Secondary | ICD-10-CM | POA: Diagnosis not present

## 2020-05-21 DIAGNOSIS — E109 Type 1 diabetes mellitus without complications: Secondary | ICD-10-CM | POA: Diagnosis not present

## 2020-05-21 DIAGNOSIS — E7800 Pure hypercholesterolemia, unspecified: Secondary | ICD-10-CM | POA: Diagnosis not present

## 2020-05-21 DIAGNOSIS — I10 Essential (primary) hypertension: Secondary | ICD-10-CM | POA: Diagnosis not present

## 2020-05-28 ENCOUNTER — Other Ambulatory Visit: Payer: Self-pay

## 2020-05-28 ENCOUNTER — Ambulatory Visit (INDEPENDENT_AMBULATORY_CARE_PROVIDER_SITE_OTHER): Payer: BC Managed Care – PPO | Admitting: Dermatology

## 2020-05-28 ENCOUNTER — Encounter: Payer: Self-pay | Admitting: Dermatology

## 2020-05-28 DIAGNOSIS — Z1283 Encounter for screening for malignant neoplasm of skin: Secondary | ICD-10-CM | POA: Diagnosis not present

## 2020-05-28 DIAGNOSIS — Z85828 Personal history of other malignant neoplasm of skin: Secondary | ICD-10-CM | POA: Diagnosis not present

## 2020-05-28 DIAGNOSIS — D1800 Hemangioma unspecified site: Secondary | ICD-10-CM | POA: Diagnosis not present

## 2020-05-29 DIAGNOSIS — E109 Type 1 diabetes mellitus without complications: Secondary | ICD-10-CM | POA: Diagnosis not present

## 2020-05-29 DIAGNOSIS — E1065 Type 1 diabetes mellitus with hyperglycemia: Secondary | ICD-10-CM | POA: Diagnosis not present

## 2020-05-29 DIAGNOSIS — Z794 Long term (current) use of insulin: Secondary | ICD-10-CM | POA: Diagnosis not present

## 2020-05-30 ENCOUNTER — Encounter: Payer: Self-pay | Admitting: Dermatology

## 2020-05-30 NOTE — Progress Notes (Signed)
   Follow-Up Visit   Subjective  Caleb Dominguez is a 59 y.o. male who presents for the following: Annual Exam (UPPER BACK THIS SUMMER A NON HEALING LESION).  General skin check Location: History of nonhealing lesion on upper back which is now clear Duration:  Quality:  Associated Signs/Symptoms: Modifying Factors:  Severity:  Timing: Context: History of nonmelanoma skin cancer  Objective  Well appearing patient in no apparent distress; mood and affect are within normal limits. Objective  Chest - Medial [facility]): Waist up skin examination- no atypical moles or non mole skin cance  Objective  Right Breast: Raised red 2 mm papule  Objective  Right Malar Cheek: White scar- clear  Objective  Left Anterior Neck: White scar- clear   All skin waist up examined.   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  The documentation on 05/30/20 for the exam, diagnosis, procedures, and orders are all accurate and complete.

## 2020-08-27 DIAGNOSIS — Z794 Long term (current) use of insulin: Secondary | ICD-10-CM | POA: Diagnosis not present

## 2020-08-27 DIAGNOSIS — E109 Type 1 diabetes mellitus without complications: Secondary | ICD-10-CM | POA: Diagnosis not present

## 2020-08-27 DIAGNOSIS — E1065 Type 1 diabetes mellitus with hyperglycemia: Secondary | ICD-10-CM | POA: Diagnosis not present

## 2020-10-30 DIAGNOSIS — H43823 Vitreomacular adhesion, bilateral: Secondary | ICD-10-CM | POA: Diagnosis not present

## 2020-10-30 DIAGNOSIS — H33193 Other retinoschisis and retinal cysts, bilateral: Secondary | ICD-10-CM | POA: Diagnosis not present

## 2020-10-30 DIAGNOSIS — E113293 Type 2 diabetes mellitus with mild nonproliferative diabetic retinopathy without macular edema, bilateral: Secondary | ICD-10-CM | POA: Diagnosis not present

## 2020-11-27 DIAGNOSIS — E1065 Type 1 diabetes mellitus with hyperglycemia: Secondary | ICD-10-CM | POA: Diagnosis not present

## 2020-11-27 DIAGNOSIS — E109 Type 1 diabetes mellitus without complications: Secondary | ICD-10-CM | POA: Diagnosis not present

## 2020-11-27 DIAGNOSIS — Z794 Long term (current) use of insulin: Secondary | ICD-10-CM | POA: Diagnosis not present

## 2021-01-13 DIAGNOSIS — E109 Type 1 diabetes mellitus without complications: Secondary | ICD-10-CM | POA: Diagnosis not present

## 2021-01-13 DIAGNOSIS — I10 Essential (primary) hypertension: Secondary | ICD-10-CM | POA: Diagnosis not present

## 2021-01-13 DIAGNOSIS — E10649 Type 1 diabetes mellitus with hypoglycemia without coma: Secondary | ICD-10-CM | POA: Diagnosis not present

## 2021-01-13 DIAGNOSIS — E7800 Pure hypercholesterolemia, unspecified: Secondary | ICD-10-CM | POA: Diagnosis not present

## 2021-02-14 DIAGNOSIS — R109 Unspecified abdominal pain: Secondary | ICD-10-CM | POA: Diagnosis not present

## 2021-02-14 DIAGNOSIS — R197 Diarrhea, unspecified: Secondary | ICD-10-CM | POA: Diagnosis not present

## 2021-02-14 DIAGNOSIS — N401 Enlarged prostate with lower urinary tract symptoms: Secondary | ICD-10-CM | POA: Diagnosis not present

## 2021-03-17 DIAGNOSIS — R197 Diarrhea, unspecified: Secondary | ICD-10-CM | POA: Diagnosis not present

## 2021-03-17 DIAGNOSIS — E1065 Type 1 diabetes mellitus with hyperglycemia: Secondary | ICD-10-CM | POA: Diagnosis not present

## 2021-03-17 DIAGNOSIS — E109 Type 1 diabetes mellitus without complications: Secondary | ICD-10-CM | POA: Diagnosis not present

## 2021-03-17 DIAGNOSIS — Z794 Long term (current) use of insulin: Secondary | ICD-10-CM | POA: Diagnosis not present

## 2021-03-18 DIAGNOSIS — R197 Diarrhea, unspecified: Secondary | ICD-10-CM | POA: Diagnosis not present

## 2021-05-28 ENCOUNTER — Other Ambulatory Visit: Payer: Self-pay

## 2021-05-28 ENCOUNTER — Ambulatory Visit: Payer: BC Managed Care – PPO | Admitting: Physician Assistant

## 2021-05-28 ENCOUNTER — Encounter: Payer: Self-pay | Admitting: Physician Assistant

## 2021-05-28 DIAGNOSIS — Z1283 Encounter for screening for malignant neoplasm of skin: Secondary | ICD-10-CM | POA: Diagnosis not present

## 2021-05-28 DIAGNOSIS — R197 Diarrhea, unspecified: Secondary | ICD-10-CM | POA: Diagnosis not present

## 2021-05-28 DIAGNOSIS — L309 Dermatitis, unspecified: Secondary | ICD-10-CM | POA: Diagnosis not present

## 2021-05-28 DIAGNOSIS — Z85828 Personal history of other malignant neoplasm of skin: Secondary | ICD-10-CM | POA: Diagnosis not present

## 2021-05-28 DIAGNOSIS — Z808 Family history of malignant neoplasm of other organs or systems: Secondary | ICD-10-CM | POA: Diagnosis not present

## 2021-05-28 MED ORDER — IMPOYZ 0.025 % EX CREA: TOPICAL_CREAM | CUTANEOUS | 0 refills | Status: AC

## 2021-06-09 DIAGNOSIS — S91332A Puncture wound without foreign body, left foot, initial encounter: Secondary | ICD-10-CM | POA: Diagnosis not present

## 2021-06-09 DIAGNOSIS — Z23 Encounter for immunization: Secondary | ICD-10-CM | POA: Diagnosis not present

## 2021-06-11 DIAGNOSIS — Z794 Long term (current) use of insulin: Secondary | ICD-10-CM | POA: Diagnosis not present

## 2021-06-11 DIAGNOSIS — E109 Type 1 diabetes mellitus without complications: Secondary | ICD-10-CM | POA: Diagnosis not present

## 2021-06-11 DIAGNOSIS — E1065 Type 1 diabetes mellitus with hyperglycemia: Secondary | ICD-10-CM | POA: Diagnosis not present

## 2021-06-16 ENCOUNTER — Encounter: Payer: Self-pay | Admitting: Physician Assistant

## 2021-06-16 NOTE — Progress Notes (Signed)
° °  Follow-Up Visit   Subjective  Caleb Dominguez is a 61 y.o. male who presents for the following: Annual Exam (Patient here for annual skin check.no new concerns. Personal and family history of bcc and scc.).   The following portions of the chart were reviewed this encounter and updated as appropriate:  Tobacco   Allergies   Meds   Problems   Med Hx   Surg Hx   Fam Hx       Objective  Well appearing patient in no apparent distress; mood and affect are within normal limits.  A full examination was performed including scalp, head, eyes, ears, nose, lips, neck, chest, axillae, abdomen, back, buttocks, bilateral upper extremities, bilateral lower extremities, hands, feet, fingers, toes, fingernails, and toenails. All findings within normal limits unless otherwise noted below.  Left Lower Leg - Posterior Thin scaly erythematous plaques.    Assessment & Plan  Eczema, unspecified type Left Lower Leg - Posterior  Clobetasol Propionate (IMPOYZ) 0.025 % CREA - Left Lower Leg - Posterior Apply to affected area qd  No atypical nevi noted at the time of the visit.  I, Percilla Tweten, PA-C, have reviewed all documentation's for this visit.  The documentation on 06/16/21 for the exam, diagnosis, procedures and orders are all accurate and complete.

## 2021-07-17 DIAGNOSIS — E7800 Pure hypercholesterolemia, unspecified: Secondary | ICD-10-CM | POA: Diagnosis not present

## 2021-07-17 DIAGNOSIS — I10 Essential (primary) hypertension: Secondary | ICD-10-CM | POA: Diagnosis not present

## 2021-07-17 DIAGNOSIS — E109 Type 1 diabetes mellitus without complications: Secondary | ICD-10-CM | POA: Diagnosis not present

## 2021-07-17 DIAGNOSIS — E10649 Type 1 diabetes mellitus with hypoglycemia without coma: Secondary | ICD-10-CM | POA: Diagnosis not present

## 2021-09-05 DIAGNOSIS — E109 Type 1 diabetes mellitus without complications: Secondary | ICD-10-CM | POA: Diagnosis not present

## 2021-09-05 DIAGNOSIS — Z794 Long term (current) use of insulin: Secondary | ICD-10-CM | POA: Diagnosis not present

## 2021-09-05 DIAGNOSIS — E1065 Type 1 diabetes mellitus with hyperglycemia: Secondary | ICD-10-CM | POA: Diagnosis not present

## 2021-12-02 DIAGNOSIS — Z794 Long term (current) use of insulin: Secondary | ICD-10-CM | POA: Diagnosis not present

## 2021-12-02 DIAGNOSIS — E109 Type 1 diabetes mellitus without complications: Secondary | ICD-10-CM | POA: Diagnosis not present

## 2021-12-02 DIAGNOSIS — E1065 Type 1 diabetes mellitus with hyperglycemia: Secondary | ICD-10-CM | POA: Diagnosis not present

## 2022-01-12 DIAGNOSIS — E109 Type 1 diabetes mellitus without complications: Secondary | ICD-10-CM | POA: Diagnosis not present

## 2022-01-12 DIAGNOSIS — E7800 Pure hypercholesterolemia, unspecified: Secondary | ICD-10-CM | POA: Diagnosis not present

## 2022-01-12 DIAGNOSIS — I10 Essential (primary) hypertension: Secondary | ICD-10-CM | POA: Diagnosis not present

## 2022-01-12 DIAGNOSIS — N401 Enlarged prostate with lower urinary tract symptoms: Secondary | ICD-10-CM | POA: Diagnosis not present

## 2022-01-12 DIAGNOSIS — R5383 Other fatigue: Secondary | ICD-10-CM | POA: Diagnosis not present

## 2022-03-04 DIAGNOSIS — Z794 Long term (current) use of insulin: Secondary | ICD-10-CM | POA: Diagnosis not present

## 2022-03-04 DIAGNOSIS — E109 Type 1 diabetes mellitus without complications: Secondary | ICD-10-CM | POA: Diagnosis not present

## 2022-03-04 DIAGNOSIS — E1065 Type 1 diabetes mellitus with hyperglycemia: Secondary | ICD-10-CM | POA: Diagnosis not present

## 2022-04-15 DIAGNOSIS — Z794 Long term (current) use of insulin: Secondary | ICD-10-CM | POA: Diagnosis not present

## 2022-04-15 DIAGNOSIS — Z0110 Encounter for examination of ears and hearing without abnormal findings: Secondary | ICD-10-CM | POA: Diagnosis not present

## 2022-04-15 DIAGNOSIS — E109 Type 1 diabetes mellitus without complications: Secondary | ICD-10-CM | POA: Diagnosis not present

## 2022-05-22 DIAGNOSIS — E1065 Type 1 diabetes mellitus with hyperglycemia: Secondary | ICD-10-CM | POA: Diagnosis not present

## 2022-05-22 DIAGNOSIS — E109 Type 1 diabetes mellitus without complications: Secondary | ICD-10-CM | POA: Diagnosis not present

## 2022-05-22 DIAGNOSIS — Z794 Long term (current) use of insulin: Secondary | ICD-10-CM | POA: Diagnosis not present

## 2022-05-26 ENCOUNTER — Ambulatory Visit: Payer: BC Managed Care – PPO | Admitting: Physician Assistant

## 2022-06-03 DIAGNOSIS — E109 Type 1 diabetes mellitus without complications: Secondary | ICD-10-CM | POA: Diagnosis not present

## 2022-06-03 DIAGNOSIS — I10 Essential (primary) hypertension: Secondary | ICD-10-CM | POA: Diagnosis not present

## 2022-06-03 DIAGNOSIS — Z0000 Encounter for general adult medical examination without abnormal findings: Secondary | ICD-10-CM | POA: Diagnosis not present

## 2022-06-03 DIAGNOSIS — Z23 Encounter for immunization: Secondary | ICD-10-CM | POA: Diagnosis not present

## 2022-06-03 DIAGNOSIS — Z125 Encounter for screening for malignant neoplasm of prostate: Secondary | ICD-10-CM | POA: Diagnosis not present

## 2022-06-03 DIAGNOSIS — E7800 Pure hypercholesterolemia, unspecified: Secondary | ICD-10-CM | POA: Diagnosis not present

## 2022-06-03 DIAGNOSIS — E113293 Type 2 diabetes mellitus with mild nonproliferative diabetic retinopathy without macular edema, bilateral: Secondary | ICD-10-CM | POA: Diagnosis not present

## 2022-06-03 DIAGNOSIS — Z1211 Encounter for screening for malignant neoplasm of colon: Secondary | ICD-10-CM | POA: Diagnosis not present

## 2022-06-09 ENCOUNTER — Ambulatory Visit: Payer: BC Managed Care – PPO | Admitting: Physician Assistant

## 2022-06-16 DIAGNOSIS — K8681 Exocrine pancreatic insufficiency: Secondary | ICD-10-CM | POA: Diagnosis not present

## 2022-06-16 DIAGNOSIS — I10 Essential (primary) hypertension: Secondary | ICD-10-CM | POA: Diagnosis not present

## 2022-06-16 DIAGNOSIS — E7800 Pure hypercholesterolemia, unspecified: Secondary | ICD-10-CM | POA: Diagnosis not present

## 2022-06-16 DIAGNOSIS — E109 Type 1 diabetes mellitus without complications: Secondary | ICD-10-CM | POA: Diagnosis not present

## 2022-08-11 DIAGNOSIS — E109 Type 1 diabetes mellitus without complications: Secondary | ICD-10-CM | POA: Diagnosis not present

## 2022-08-11 DIAGNOSIS — E1065 Type 1 diabetes mellitus with hyperglycemia: Secondary | ICD-10-CM | POA: Diagnosis not present

## 2022-08-11 DIAGNOSIS — Z794 Long term (current) use of insulin: Secondary | ICD-10-CM | POA: Diagnosis not present

## 2022-08-21 DIAGNOSIS — R748 Abnormal levels of other serum enzymes: Secondary | ICD-10-CM | POA: Diagnosis not present

## 2022-08-21 DIAGNOSIS — Z23 Encounter for immunization: Secondary | ICD-10-CM | POA: Diagnosis not present

## 2022-11-04 DIAGNOSIS — E109 Type 1 diabetes mellitus without complications: Secondary | ICD-10-CM | POA: Diagnosis not present

## 2022-11-04 DIAGNOSIS — Z794 Long term (current) use of insulin: Secondary | ICD-10-CM | POA: Diagnosis not present

## 2022-12-15 DIAGNOSIS — K8681 Exocrine pancreatic insufficiency: Secondary | ICD-10-CM | POA: Diagnosis not present

## 2022-12-15 DIAGNOSIS — I10 Essential (primary) hypertension: Secondary | ICD-10-CM | POA: Diagnosis not present

## 2022-12-15 DIAGNOSIS — E109 Type 1 diabetes mellitus without complications: Secondary | ICD-10-CM | POA: Diagnosis not present

## 2022-12-15 DIAGNOSIS — E7800 Pure hypercholesterolemia, unspecified: Secondary | ICD-10-CM | POA: Diagnosis not present

## 2022-12-29 DIAGNOSIS — E113293 Type 2 diabetes mellitus with mild nonproliferative diabetic retinopathy without macular edema, bilateral: Secondary | ICD-10-CM | POA: Diagnosis not present

## 2022-12-29 DIAGNOSIS — H33193 Other retinoschisis and retinal cysts, bilateral: Secondary | ICD-10-CM | POA: Diagnosis not present

## 2022-12-29 DIAGNOSIS — H43823 Vitreomacular adhesion, bilateral: Secondary | ICD-10-CM | POA: Diagnosis not present

## 2023-01-29 DIAGNOSIS — E109 Type 1 diabetes mellitus without complications: Secondary | ICD-10-CM | POA: Diagnosis not present

## 2023-01-29 DIAGNOSIS — Z794 Long term (current) use of insulin: Secondary | ICD-10-CM | POA: Diagnosis not present

## 2023-02-16 DIAGNOSIS — E109 Type 1 diabetes mellitus without complications: Secondary | ICD-10-CM | POA: Diagnosis not present

## 2023-02-16 DIAGNOSIS — Z794 Long term (current) use of insulin: Secondary | ICD-10-CM | POA: Diagnosis not present

## 2023-04-27 DIAGNOSIS — Z794 Long term (current) use of insulin: Secondary | ICD-10-CM | POA: Diagnosis not present

## 2023-04-27 DIAGNOSIS — E109 Type 1 diabetes mellitus without complications: Secondary | ICD-10-CM | POA: Diagnosis not present

## 2023-06-07 DIAGNOSIS — Z23 Encounter for immunization: Secondary | ICD-10-CM | POA: Diagnosis not present

## 2023-06-07 DIAGNOSIS — Z0000 Encounter for general adult medical examination without abnormal findings: Secondary | ICD-10-CM | POA: Diagnosis not present

## 2023-06-08 ENCOUNTER — Other Ambulatory Visit: Payer: Self-pay | Admitting: Family Medicine

## 2023-06-08 DIAGNOSIS — E782 Mixed hyperlipidemia: Secondary | ICD-10-CM

## 2023-06-18 ENCOUNTER — Ambulatory Visit
Admission: RE | Admit: 2023-06-18 | Discharge: 2023-06-18 | Disposition: A | Discharge status: Home / Self Care | Payer: No Typology Code available for payment source | Source: Ambulatory Visit | Attending: Family Medicine | Admitting: Family Medicine

## 2023-06-18 DIAGNOSIS — E782 Mixed hyperlipidemia: Secondary | ICD-10-CM
